# Patient Record
Sex: Male | Born: 1960 | Race: Black or African American | Hispanic: No | Marital: Single | Smoking: Never smoker
Health system: Southern US, Community
[De-identification: ages and names within clinical notes are randomized; demographics above are authoritative.]

## PROBLEM LIST (undated history)

## (undated) DIAGNOSIS — I10 Essential (primary) hypertension: Secondary | ICD-10-CM

---

## 2004-05-31 ENCOUNTER — Emergency Department (HOSPITAL_COMMUNITY): Admission: EM | Admit: 2004-05-31 | Discharge: 2004-05-31 | Payer: Self-pay | Admitting: Emergency Medicine

## 2004-08-25 ENCOUNTER — Ambulatory Visit (HOSPITAL_COMMUNITY): Admission: RE | Admit: 2004-08-25 | Discharge: 2004-08-25 | Payer: Self-pay | Admitting: Cardiology

## 2013-05-31 ENCOUNTER — Emergency Department (INDEPENDENT_AMBULATORY_CARE_PROVIDER_SITE_OTHER): Payer: BC Managed Care – PPO

## 2013-05-31 ENCOUNTER — Emergency Department (HOSPITAL_COMMUNITY)
Admission: EM | Admit: 2013-05-31 | Discharge: 2013-05-31 | Disposition: A | Discharge status: Home / Self Care | Payer: BC Managed Care – PPO | Source: Home / Self Care | Attending: Family Medicine | Admitting: Family Medicine

## 2013-05-31 ENCOUNTER — Encounter (HOSPITAL_COMMUNITY): Payer: Self-pay | Admitting: Emergency Medicine

## 2013-05-31 DIAGNOSIS — S8000XA Contusion of unspecified knee, initial encounter: Secondary | ICD-10-CM

## 2013-05-31 DIAGNOSIS — S6390XA Sprain of unspecified part of unspecified wrist and hand, initial encounter: Secondary | ICD-10-CM

## 2013-05-31 DIAGNOSIS — S66911A Strain of unspecified muscle, fascia and tendon at wrist and hand level, right hand, initial encounter: Secondary | ICD-10-CM

## 2013-05-31 DIAGNOSIS — S8001XA Contusion of right knee, initial encounter: Secondary | ICD-10-CM

## 2013-05-31 DIAGNOSIS — X58XXXA Exposure to other specified factors, initial encounter: Secondary | ICD-10-CM

## 2013-05-31 HISTORY — DX: Essential (primary) hypertension: I10

## 2013-05-31 NOTE — ED Provider Notes (Signed)
CSN: [id number]     Arrival date & time 05/31/13  1129 History   First MD Initiated Contact with Patient 05/31/13 1302     Chief Complaint  Patient presents with  . Joint Pain   (Consider location/radiation/quality/duration/timing/severity/associated sxs/prior Treatment) HPI Comments: Patient reports that he struck his right medial knee on the edge of his forklift about one week ago and area has remained uncomfortable with weight bearing since injury.  Then, in an unrelated incident, he reports that about one month ago he was lifting a 45 lb barbell at home and developed a "stinging pain" in the palm of his right hand and he would like this area evaluated today as well.  No previous injuries or surgeries to right hand or right knee.   The history is provided by the patient.    Past Medical History  Diagnosis Date  . Hypertension    History reviewed. No pertinent past surgical history. History reviewed. No pertinent family history. History  Substance Use Topics  . Smoking status: Never Smoker   . Smokeless tobacco: Not on file  . Alcohol Use: Yes    Review of Systems  All other systems reviewed and are negative.    Allergies  Review of patient's allergies indicates no known allergies.  Home Medications   Current Outpatient Rx  Name  Route  Sig  Dispense  Refill  . carvedilol (COREG) 12.5 MG tablet   Oral   Take 12.5 mg by mouth 2 (two) times daily with a meal.         . lisinopril (PRINIVIL,ZESTRIL) 20 MG tablet   Oral   Take 20 mg by mouth daily.          BP 145/98  Pulse 75  Temp(Src) 97.9 F (36.6 C) (Oral)  Resp 12  SpO2 98% Physical Exam  Nursing note and vitals reviewed. Constitutional: He is oriented to person, place, and time. He appears well-developed and well-nourished. No distress.  HENT:  Head: Normocephalic and atraumatic.  Eyes: Conjunctivae are normal.  Cardiovascular: Normal rate.   Pulmonary/Chest: Effort normal.  Musculoskeletal:      Right knee: He exhibits normal range of motion, no swelling, no effusion, no ecchymosis, no deformity, no laceration, no erythema and no bony tenderness.       Hands:      Legs: Neurological: He is alert and oriented to person, place, and time.  Skin: Skin is warm and dry.  Psychiatric: He has a normal mood and affect. His behavior is normal.    ED Course  Procedures (including critical care time) Labs Review Labs Reviewed - No data to display Imaging Review Dg Knee Complete 4 Views Right  05/31/2013   CLINICAL DATA:  Joint pain.  Medial knee pain.  EXAM: RIGHT KNEE - COMPLETE 4+ VIEW  COMPARISON:  None.  FINDINGS: There is no evidence of fracture, dislocation, or joint effusion. There is no evidence of arthropathy or other focal bone abnormality. Soft tissues are unremarkable.  IMPRESSION: Negative.   Electronically Signed   By: Sebastian AcheAllen  Grady   On: 05/31/2013 13:48   Dg Hand Complete Right  05/31/2013   CLINICAL DATA:  Injury right hand months ago lifting weights, pain at palmar surface of third MCP joint  EXAM: RIGHT HAND - COMPLETE 3+ VIEW  COMPARISON:  None  FINDINGS: Osseous mineralization normal.  Joint spaces preserved.  No fracture, dislocation, or bone destruction.  IMPRESSION: Normal exam.   Electronically Signed   By: Loraine LericheMark  Tyron Russell M.D.   On: 05/31/2013 13:49     MDM   1. Contusion of right knee   2. Strain of right hand    States he will not be filing Workman's Comp. Xrays of right knee and hand without bony abnormality. Advised using NSAIDs or tylenol as directed on packaging for discomfort. Follow up if no improvement.   Jess Barters [place], [place] 05/31/13 [phone]

## 2013-05-31 NOTE — ED Notes (Signed)
injury to right hand ~3-4 weeks ago, pain in right knee after struck on equipment 1 week ago. NAD

## 2013-05-31 NOTE — Discharge Instructions (Signed)
The xrays of your right knee and right hand were both normal. I would use either tylenol or ibuprofen as directed on packaging for discomfort and follow up with your primary care provider if symptoms do not improve.  Contusion A contusion is a deep bruise. Contusions are the result of an injury that caused bleeding under the skin. The contusion may turn blue, purple, or yellow. Minor injuries will give you a painless contusion, but more severe contusions may stay painful and swollen for a few weeks.  CAUSES  A contusion is usually caused by a blow, trauma, or direct force to an area of the body. SYMPTOMS   Swelling and redness of the injured area.  Bruising of the injured area.  Tenderness and soreness of the injured area.  Pain. DIAGNOSIS  The diagnosis can be made by taking a history and physical exam. An X-ray, CT scan, or MRI may be needed to determine if there were any associated injuries, such as fractures. TREATMENT  Specific treatment will depend on what area of the body was injured. In general, the best treatment for a contusion is resting, icing, elevating, and applying cold compresses to the injured area. Over-the-counter medicines may also be recommended for pain control. Ask your caregiver what the best treatment is for your contusion. HOME CARE INSTRUCTIONS   Put ice on the injured area.  Put ice in a plastic bag.  Place a towel between your skin and the bag.  Leave the ice on for 15-20 minutes, 03-04 times a day.  Only take over-the-counter or prescription medicines for pain, discomfort, or fever as directed by your caregiver. Your caregiver may recommend avoiding anti-inflammatory medicines (aspirin, ibuprofen, and naproxen) for 48 hours because these medicines may increase bruising.  Rest the injured area.  If possible, elevate the injured area to reduce swelling. SEEK IMMEDIATE MEDICAL CARE IF:   You have increased bruising or swelling.  You have pain that is  getting worse.  Your swelling or pain is not relieved with medicines. MAKE SURE YOU:   Understand these instructions.  Will watch your condition.  Will get help right away if you are not doing well or get worse. Document Released: 12/08/2004 Document Revised: 05/23/2011 Document Reviewed: 01/03/2011 [facility] Patient Information 2014 [employer], [place].

## 2013-06-02 NOTE — ED Provider Notes (Signed)
Medical screening examination/treatment/procedure(s) were performed by resident physician or non-physician practitioner and as supervising physician I was immediately available for consultation/collaboration.   Jarquavious Fentress DOUGLAS MD.   Daveda Larock D Wellington Winegarden, MD 06/02/13 1019 

## 2015-08-25 IMAGING — CR DG KNEE COMPLETE 4+V*R*
4 series · 4 of 4 positions shown · non-contrast
Comparison: None.

CLINICAL DATA: Joint pain.  Medial knee pain.

EXAM:
RIGHT KNEE - COMPLETE 4+ VIEW

[view not recorded (1 of 4)]
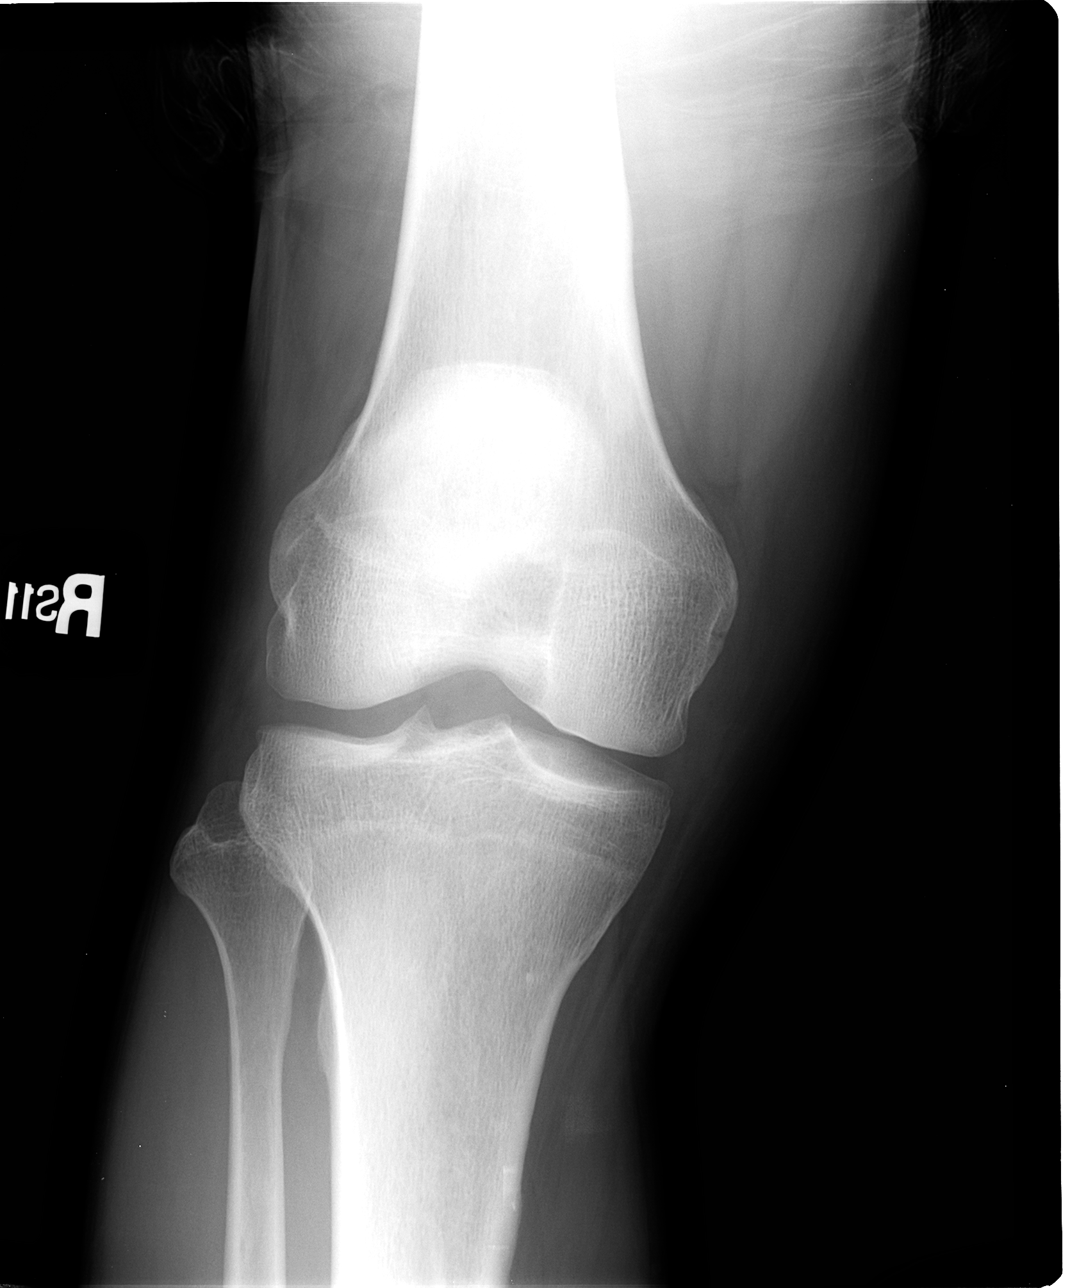

[view not recorded (2 of 4)]
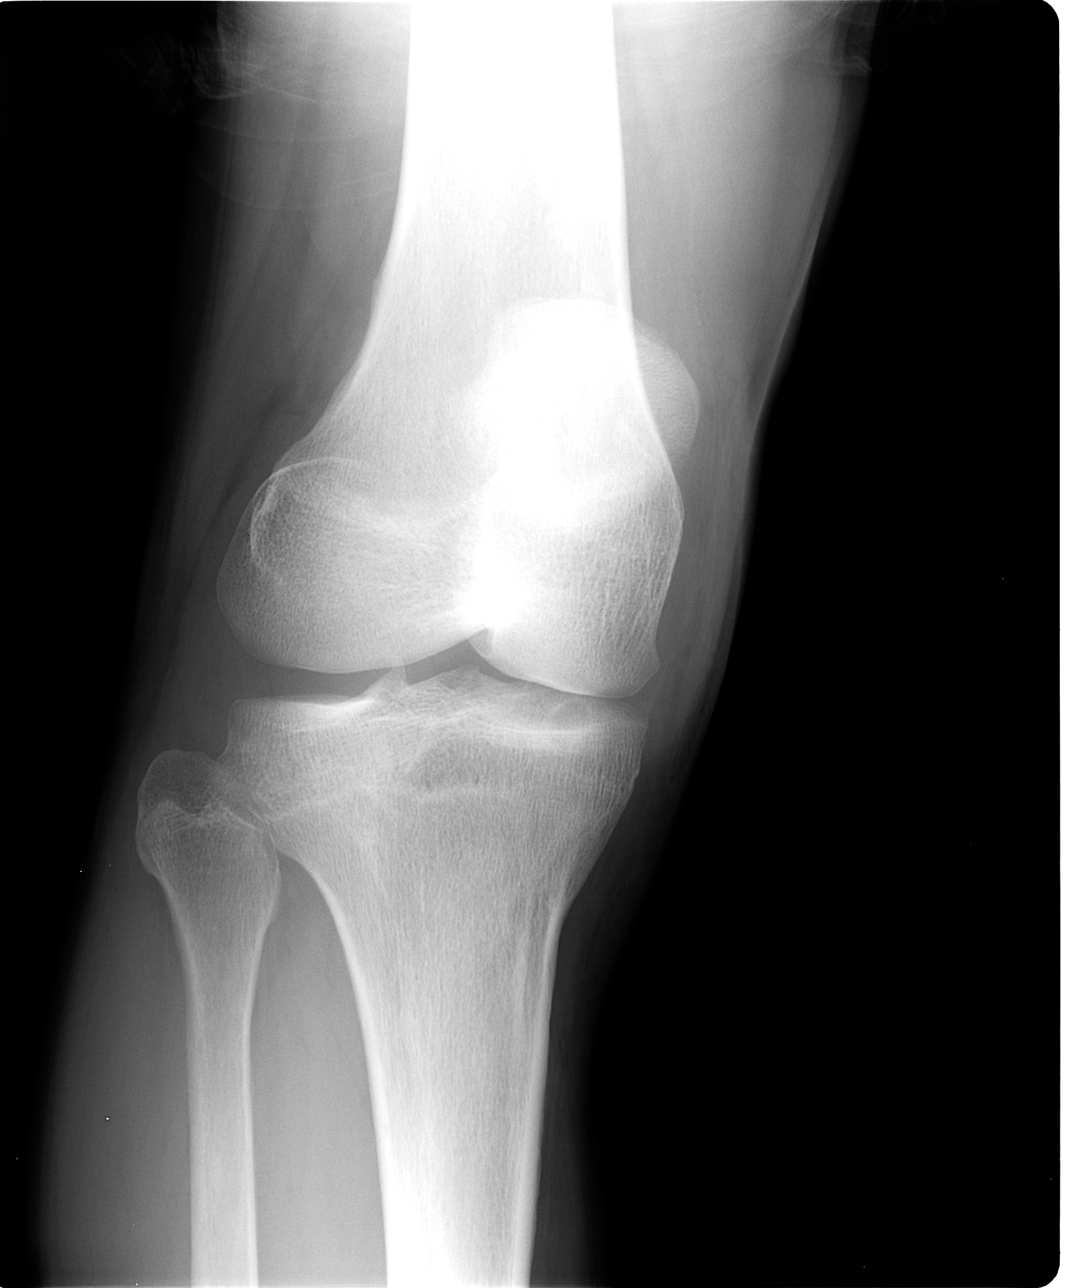

[view not recorded (3 of 4)]
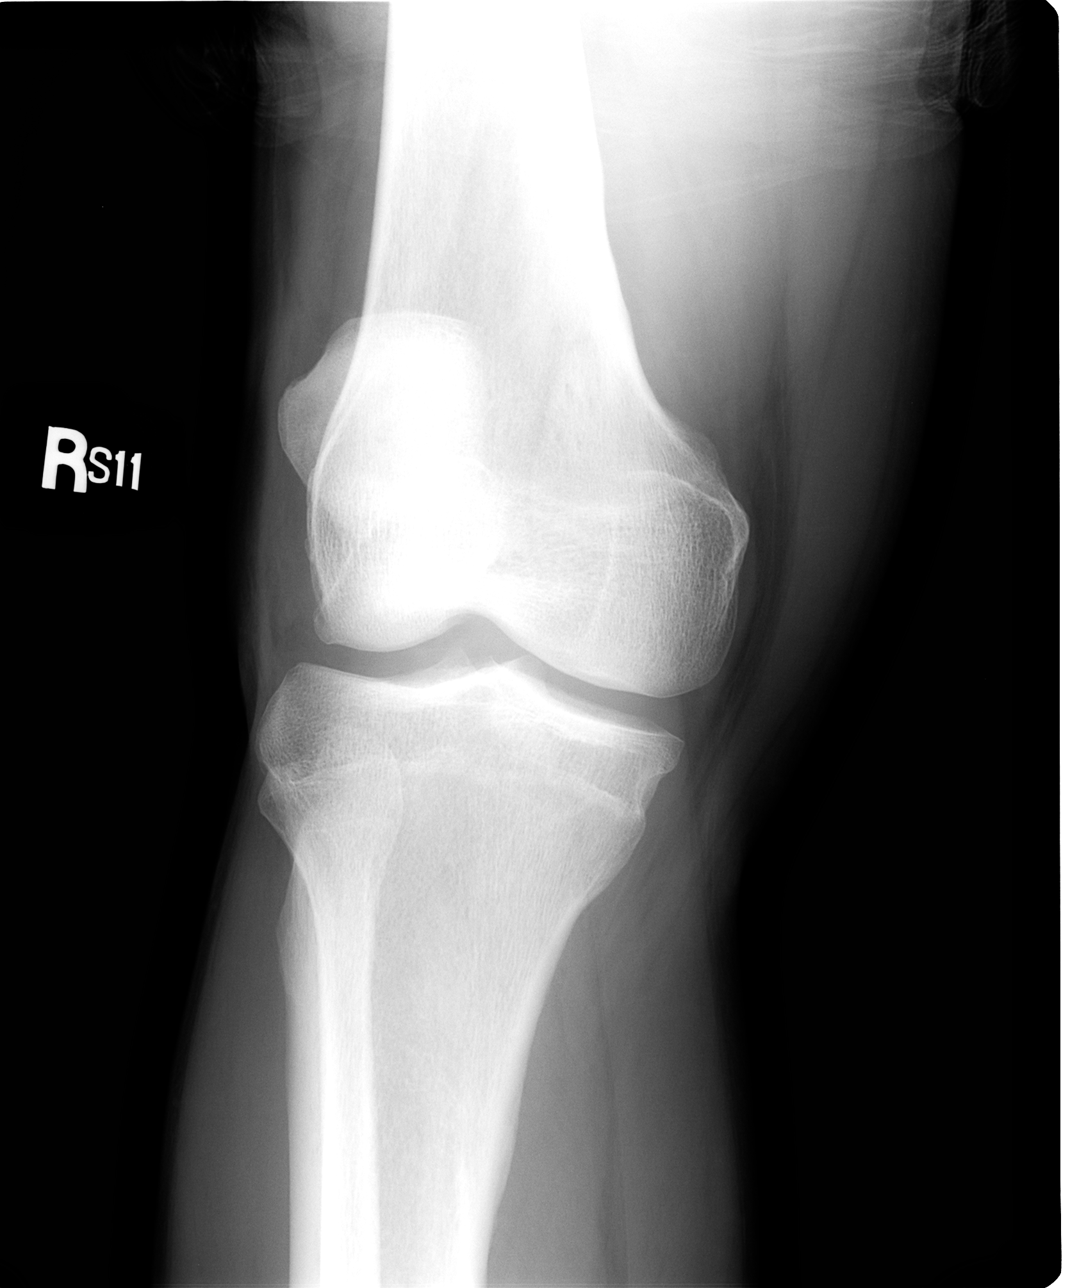

[view not recorded (4 of 4)]
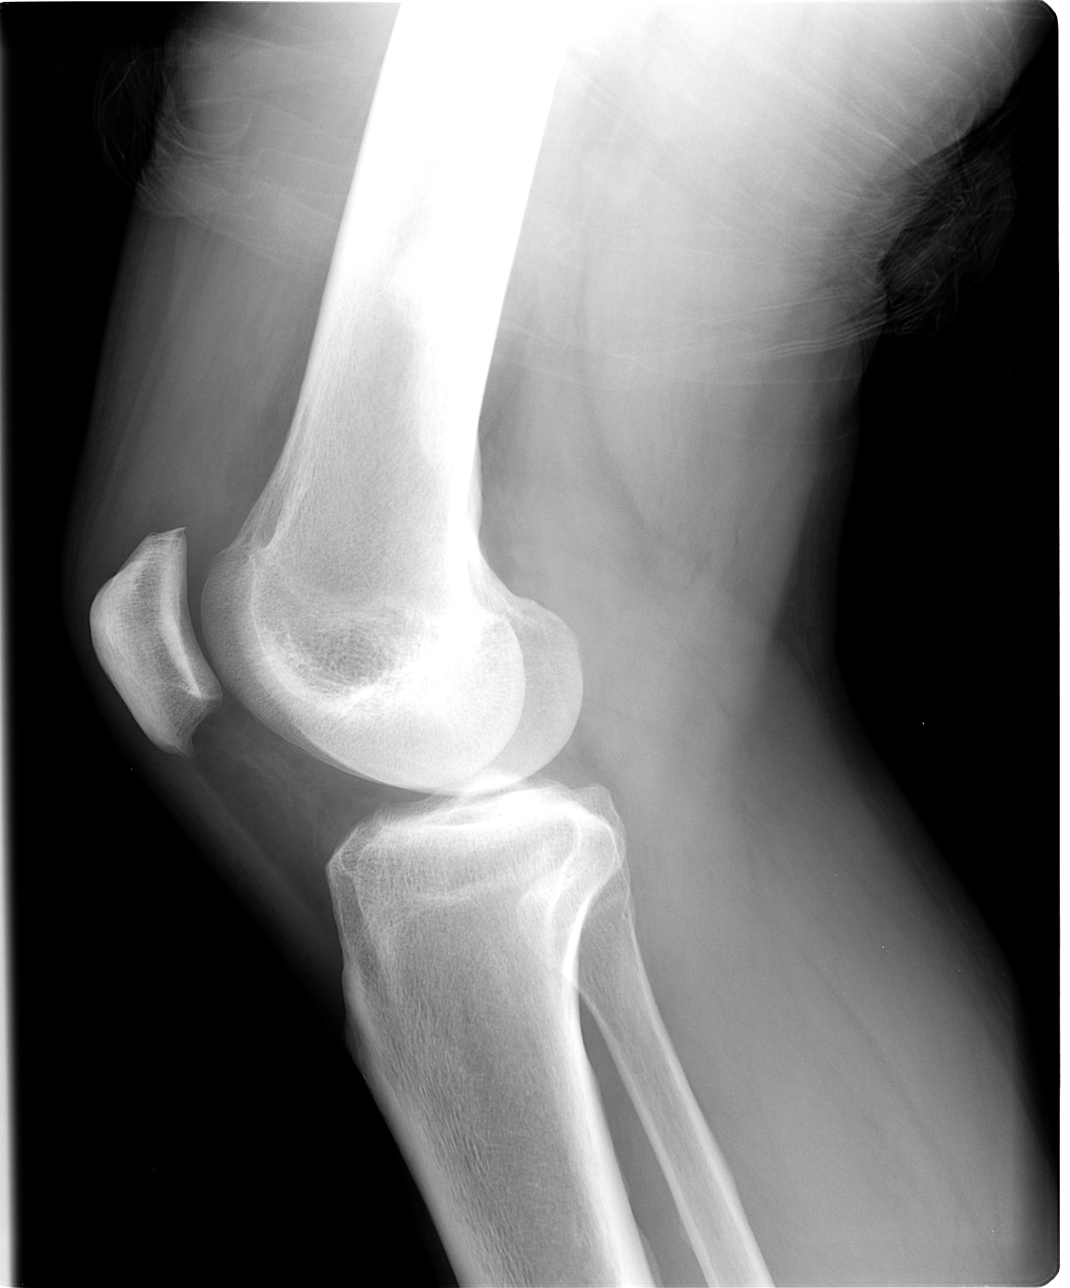

[4 of 4 positions shown; findings below may reference images not displayed]

FINDINGS: There is no evidence of fracture, dislocation, or joint effusion.
There is no evidence of arthropathy or other focal bone abnormality.
Soft tissues are unremarkable.
IMPRESSION: Negative.

## 2018-02-19 ENCOUNTER — Encounter (HOSPITAL_COMMUNITY): Payer: Self-pay | Admitting: Emergency Medicine

## 2018-02-19 ENCOUNTER — Emergency Department (HOSPITAL_COMMUNITY)
Admission: EM | Admit: 2018-02-19 | Discharge: 2018-02-19 | Disposition: A | Discharge status: Home / Self Care | Payer: BLUE CROSS/BLUE SHIELD | Attending: Emergency Medicine | Admitting: Emergency Medicine

## 2018-02-19 ENCOUNTER — Other Ambulatory Visit: Payer: Self-pay

## 2018-02-19 DIAGNOSIS — I10 Essential (primary) hypertension: Secondary | ICD-10-CM | POA: Insufficient documentation

## 2018-02-19 DIAGNOSIS — Z79899 Other long term (current) drug therapy: Secondary | ICD-10-CM | POA: Diagnosis not present

## 2018-02-19 DIAGNOSIS — M545 Low back pain, unspecified: Secondary | ICD-10-CM

## 2018-02-19 LAB — BASIC METABOLIC PANEL
Anion gap: 8 (ref 5–15)
BUN: 12 mg/dL (ref 6–20)
CALCIUM: 9.5 mg/dL (ref 8.9–10.3)
CHLORIDE: 104 mmol/L (ref 98–111)
CO2: 28 mmol/L (ref 22–32)
CREATININE: 1.28 mg/dL — AB (ref 0.61–1.24)
GFR calc Af Amer: >60 mL/min (ref >60)
GFR calc non Af Amer: >60 mL/min (ref >60)
Glucose, Bld: 87 mg/dL (ref 70–99)
Potassium: 4.3 mmol/L (ref 3.5–5.1)
SODIUM: 140 mmol/L (ref 135–145)

## 2018-02-19 LAB — CBC WITH DIFFERENTIAL/PLATELET
Abs Immature Granulocytes: 0.01 10*3/uL (ref 0.00–0.07)
Basophils Absolute: 0.1 10*3/uL (ref 0.0–0.1)
Basophils Relative: 1 %
EOS ABS: 0.1 10*3/uL (ref 0.0–0.5)
EOS PCT: 3 %
HCT: 47.5 % (ref 39.0–52.0)
HEMOGLOBIN: 15.4 g/dL (ref 13.0–17.0)
Immature Granulocytes: 0 %
LYMPHS PCT: 44 %
Lymphs Abs: 2.1 10*3/uL (ref 0.7–4.0)
MCH: 29.6 pg (ref 26.0–34.0)
MCHC: 32.4 g/dL (ref 30.0–36.0)
MCV: 91.3 fL (ref 80.0–100.0)
MONO ABS: 0.5 10*3/uL (ref 0.1–1.0)
Monocytes Relative: 11 %
Neutro Abs: 2 10*3/uL (ref 1.7–7.7)
Neutrophils Relative %: 41 %
Platelets: 245 10*3/uL (ref 150–400)
RBC: 5.2 MIL/uL (ref 4.22–5.81)
RDW: 11.7 % (ref 11.5–15.5)
WBC: 4.8 10*3/uL (ref 4.0–10.5)
nRBC: 0 % (ref 0.0–0.2)

## 2018-02-19 LAB — URINALYSIS, ROUTINE W REFLEX MICROSCOPIC
BILIRUBIN URINE: NEGATIVE
Glucose, UA: NEGATIVE mg/dL
HGB URINE DIPSTICK: NEGATIVE
KETONES UR: NEGATIVE mg/dL
Leukocytes, UA: NEGATIVE
NITRITE: NEGATIVE
Protein, ur: NEGATIVE mg/dL
SPECIFIC GRAVITY, URINE: 1.014 (ref 1.005–1.030)
pH: 5 (ref 5.0–8.0)

## 2018-02-19 MED ORDER — DIAZEPAM 10 MG PO TABS: 10.0000 mg | ORAL_TABLET | Freq: Four times a day (QID) | ORAL | 0 refills | Status: DC | PRN

## 2018-02-19 MED ORDER — KETOROLAC TROMETHAMINE 30 MG/ML IJ SOLN
15.0000 mg | Freq: Once | INTRAMUSCULAR | Status: AC
  Administered 2018-02-19: 15 mg via INTRAVENOUS
  Filled 2018-02-19: qty 1

## 2018-02-19 NOTE — ED Provider Notes (Signed)
[facility] EMERGENCY DEPARTMENT Provider Note   CSN: [id number] Arrival date & time: 02/19/18  4098     History   Chief Complaint Chief Complaint  Patient presents with  . Flank Pain    HPI Martin Small is a 57 y.o. male.  HPI  He presents for evaluation of right lower back pain present for 2 weeks, worse when ambulating especially going downstairs.  He has had some urinary frequency but no dysuria or hematuria.  No known trauma.  No prior similar incidents.  He has been using Tylenol with partial relief.  Pain comes and goes at times he has no pain.  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  There are no other known modifying factors.  Past Medical History:  Diagnosis Date  . Hypertension     There are no active problems to display for this patient.   History reviewed. No pertinent surgical history.      Home Medications    Prior to Admission medications   Medication Sig Start Date End Date Taking? Authorizing Provider  carvedilol (COREG) 12.5 MG tablet Take 12.5 mg by mouth 2 (two) times daily with a meal.    [provider]  lisinopril (PRINIVIL,ZESTRIL) 20 MG tablet Take 20 mg by mouth daily.    [provider]    Family History No family history on file.  Social History Social History   Tobacco Use  . Smoking status: Never Smoker  . Smokeless tobacco: Never Used  Substance Use Topics  . Alcohol use: Yes  . Drug use: Not Currently     Allergies   Patient has no known allergies.   Review of Systems Review of Systems  All other systems reviewed and are negative.    Physical Exam Updated Vital Signs BP (!) 177/110 (BP Location: Left Arm)   Pulse 81   Temp 97.7 F (36.5 C) (Oral)   Resp 17   Ht 6\' 1"  (1.854 m)   Wt 97.5 kg   SpO2 97%   BMI 28.37 kg/m   Physical Exam  Constitutional: He is oriented to person, place, and time. He appears well-developed and  well-nourished. He appears distressed (Somewhat uncomfortable).  HENT:  Head: Normocephalic and atraumatic.  Right Ear: External ear normal.  Left Ear: External ear normal.  Eyes: Pupils are equal, round, and reactive to light. Conjunctivae and EOM are normal.  Neck: Normal range of motion and phonation normal. Neck supple.  Cardiovascular: Normal rate, regular rhythm and normal heart sounds.  Pulmonary/Chest: Effort normal and breath sounds normal. He exhibits no bony tenderness.  Abdominal: Soft. There is no tenderness.  Genitourinary:  Genitourinary Comments: No costovertebral angle tenderness.  Musculoskeletal: Normal range of motion.  Mild right lumbar tenderness.  Neurological: He is alert and oriented to person, place, and time. No cranial nerve deficit or sensory deficit. He exhibits normal muscle tone. Coordination normal.  Skin: Skin is warm, dry and intact.  Psychiatric: He has a normal mood and affect. His behavior is normal. Judgment and thought content normal.  Nursing note and vitals reviewed.    ED Treatments / Results  Labs (all labs ordered are listed, but only abnormal results are displayed) Labs Reviewed  URINALYSIS, ROUTINE W REFLEX MICROSCOPIC    EKG None  Radiology No results found.  Procedures Procedures (including critical care time)  Medications Ordered in ED Medications - No data to display   Initial Impression /  Assessment and Plan / ED Course  I have reviewed the triage vital signs and the nursing notes.  Pertinent labs & imaging results that were available during my care of the patient were reviewed by me and considered in my medical decision making (see chart for details).  Clinical Course as of Feb 20 903  Mon Feb 19, 2018  0903 Normal  CBC with Differential [EW]  0903 Normal except creatinine slightly elevated  Basic metabolic panel(!) [EW]  [id number] Normal  Urinalysis, Routine w reflex microscopic- may I&O cath if menses(!) [EW]      Clinical Course User Index [EW] Mancel BaleWentz, Kindell Strada, MD     Patient Vitals for the past 24 hrs:  BP Temp Temp src Pulse Resp SpO2 Height Weight  02/19/18 0715 (!) 177/98 - - 83 - 99 % - -  02/19/18 0655 (!) 177/110 97.7 F (36.5 C) Oral 81 17 97 % - -  02/19/18 0648 - - - - - - 6\' 1"  (1.854 m) 97.5 kg    9:03 AM Reevaluation with update and discussion. After initial assessment and treatment, an updated evaluation reveals he is comfortable at this time.  My male is with him who request that he get something for pain, and a muscle relaxer.  I discussed this with the patient who prefers to take ibuprofen for pain, and agrees to a prescription muscle relaxer.  He will have to stay out of work for 2 days, while using the muscle relaxer.  All questions answered. Mancel BaleElliott Severiano Utsey   Medical Decision Making: Nonspecific low back pain likely muscular.  Doubt myelopathy, fracture, UTI, metabolic instability or impending vascular collapse.  CRITICAL CARE-no Performed by: Mancel BaleElliott Adis Sturgill   Nursing Notes Reviewed/ Care Coordinated Applicable Imaging Reviewed Interpretation of Laboratory Data incorporated into ED treatment  The patient appears reasonably screened and/or stabilized for discharge and I doubt any other medical condition or other [facility] requiring further screening, evaluation, or treatment in the ED at this time prior to discharge.  Plan: Home Medications-ibuprofen 3 times daily with meals; Home Treatments-rest, heat, gradually increase activity; return here if the recommended treatment, does not improve the symptoms; Recommended follow up- PCP, if not better in 4 to 5 days.     Final Clinical Impressions(s) / ED Diagnoses   Final diagnoses:  None    ED Discharge Orders    None       Mancel BaleWentz, Rinaldo Macqueen, MD 02/19/18 [phone]

## 2018-02-19 NOTE — Discharge Instructions (Signed)
Use heat on the lower back several times a day for 30 to 40 minutes.  Try gently stretching your back, as your pain improves.  See the doctor of your choice if not better in 4 or 5 days.  To help the pain, use ibuprofen 3 times a day with meals for 1 week.  Do not drive, or work, while using the muscle relaxer.

## 2018-02-19 NOTE — ED Triage Notes (Signed)
C/o R flank pain x 2 weeks that is gradually getting worse.  Also reports frequent urination.

## 2018-02-19 NOTE — ED Notes (Signed)
Signature pad not available. Patient verbalized understanding of discharge instructions.   

## 2024-01-07 ENCOUNTER — Emergency Department (HOSPITAL_COMMUNITY)
Admission: EM | Admit: 2024-01-07 | Discharge: 2024-01-07 | Disposition: A | Discharge status: Home / Self Care | Attending: Emergency Medicine | Admitting: Emergency Medicine

## 2024-01-07 ENCOUNTER — Encounter (HOSPITAL_COMMUNITY): Payer: Self-pay | Admitting: Emergency Medicine

## 2024-01-07 ENCOUNTER — Other Ambulatory Visit: Payer: Self-pay

## 2024-01-07 DIAGNOSIS — R059 Cough, unspecified: Secondary | ICD-10-CM | POA: Diagnosis present

## 2024-01-07 DIAGNOSIS — M5417 Radiculopathy, lumbosacral region: Secondary | ICD-10-CM | POA: Diagnosis not present

## 2024-01-07 LAB — URINALYSIS, ROUTINE W REFLEX MICROSCOPIC
Bilirubin Urine: NEGATIVE
Glucose, UA: NEGATIVE mg/dL
Hgb urine dipstick: NEGATIVE
Ketones, ur: NEGATIVE mg/dL
Leukocytes,Ua: NEGATIVE
Nitrite: NEGATIVE
Protein, ur: NEGATIVE mg/dL
Specific Gravity, Urine: 1.019 (ref 1.005–1.030)
pH: 5 (ref 5.0–8.0)

## 2024-01-07 LAB — RESP PANEL BY RT-PCR (RSV, FLU A&B, COVID)  RVPGX2
Influenza A by PCR: NEGATIVE
Influenza B by PCR: NEGATIVE
Resp Syncytial Virus by PCR: NEGATIVE
SARS Coronavirus 2 by RT PCR: NEGATIVE

## 2024-01-07 MED ORDER — DICLOFENAC EPOLAMINE 1.3 % EX PTCH: 1.0000 | MEDICATED_PATCH | Freq: Two times a day (BID) | CUTANEOUS | 1 refills | Status: DC

## 2024-01-07 MED ORDER — DICLOFENAC EPOLAMINE 1.3 % EX PTCH: 1.0000 | MEDICATED_PATCH | Freq: Two times a day (BID) | CUTANEOUS | 1 refills | Status: AC

## 2024-01-07 MED ORDER — KETOROLAC TROMETHAMINE 15 MG/ML IJ SOLN
15.0000 mg | Freq: Once | INTRAMUSCULAR | Status: AC
  Administered 2024-01-07: 15 mg via INTRAMUSCULAR
  Filled 2024-01-07: qty 1

## 2024-01-07 MED ORDER — LISINOPRIL 20 MG PO TABS: 20.0000 mg | ORAL_TABLET | Freq: Every day | ORAL | 1 refills | Status: AC

## 2024-01-07 MED ORDER — CARVEDILOL 12.5 MG PO TABS: 12.5000 mg | ORAL_TABLET | Freq: Two times a day (BID) | ORAL | 1 refills | Status: DC

## 2024-01-07 MED ORDER — CARVEDILOL 12.5 MG PO TABS
12.5000 mg | ORAL_TABLET | Freq: Two times a day (BID) | ORAL | Status: DC
  Administered 2024-01-07: 12.5 mg via ORAL
  Filled 2024-01-07 (×2): qty 1

## 2024-01-07 MED ORDER — DICLOFENAC EPOLAMINE 1.3 % EX PTCH
1.0000 | MEDICATED_PATCH | Freq: Two times a day (BID) | CUTANEOUS | Status: DC
  Administered 2024-01-07: 1 via TRANSDERMAL
  Filled 2024-01-07 (×2): qty 1

## 2024-01-07 MED ORDER — HYDROCODONE-ACETAMINOPHEN 5-325 MG PO TABS
1.0000 | ORAL_TABLET | Freq: Once | ORAL | Status: AC
  Administered 2024-01-07: 1 via ORAL
  Filled 2024-01-07: qty 1

## 2024-01-07 MED ORDER — LISINOPRIL 20 MG PO TABS: 20.0000 mg | ORAL_TABLET | Freq: Every day | ORAL | 1 refills | Status: DC

## 2024-01-07 MED ORDER — CARVEDILOL 12.5 MG PO TABS: 12.5000 mg | ORAL_TABLET | Freq: Two times a day (BID) | ORAL | 1 refills | Status: AC

## 2024-01-07 MED ORDER — DEXAMETHASONE SOD PHOSPHATE PF 10 MG/ML IJ SOLN
10.0000 mg | Freq: Once | INTRAMUSCULAR | Status: AC
  Administered 2024-01-07: 10 mg via INTRAMUSCULAR

## 2024-01-07 MED ORDER — PREDNISONE 20 MG PO TABS: 40.0000 mg | ORAL_TABLET | Freq: Every day | ORAL | 0 refills | Status: DC

## 2024-01-07 MED ORDER — PREDNISONE 20 MG PO TABS: 40.0000 mg | ORAL_TABLET | Freq: Every day | ORAL | 0 refills | Status: AC

## 2024-01-07 MED ORDER — ACETAMINOPHEN 500 MG PO TABS
1000.0000 mg | ORAL_TABLET | Freq: Once | ORAL | Status: AC
Start: 2024-01-07 — End: 2024-01-07
  Administered 2024-01-07: 1000 mg via ORAL
  Filled 2024-01-07: qty 2

## 2024-01-07 MED ORDER — LISINOPRIL 10 MG PO TABS
20.0000 mg | ORAL_TABLET | Freq: Every day | ORAL | Status: DC
  Administered 2024-01-07: 20 mg via ORAL
  Filled 2024-01-07 (×2): qty 2

## 2024-01-07 NOTE — Discharge Instructions (Addendum)
 Today's evaluation has been generally reassuring.  It is important to obtain and take your medication as prescribed.  If the medicated patches are not covered by insurance, consider obtaining over-the-counter Salonpas products with the ingredients methyl salicylate and lidocaine and lieu of the patches. You have been provided multiple avenues for primary care follow-up, please arrange this in the coming days.  Return here for concerning changes in condition.

## 2024-01-07 NOTE — ED Provider Notes (Signed)
 Patient with mild temperature increase just prior to departure.  COVID flu sent, urinalysis sent.  Urinalysis unremarkable.  Patient requests discharge, and after receiving Tylenol, with no distress, sitting upright, no evidence of bacteremia, sepsis, patient will follow COVID flu results as an outpatient.   Garrick Charleston, MD 01/07/24 [phone]

## 2024-01-07 NOTE — ED Triage Notes (Signed)
 Pt reports posterior right leg pain that started on Wednesday. Pt reports the pain is worse when he bends over. Pt also c/o weakness in the right eye.

## 2024-01-07 NOTE — ED Provider Notes (Signed)
 [place] EMERGENCY DEPARTMENT AT [facility] Provider Note   CSN: [id number] Arrival date & time: 01/07/24  1205     Patient presents with: Leg Pain   Martin Small is a 63 y.o. male.   HPI Patient with a history of sciatica presents with leg pain, 4 days. He notes that he did possibly sleep funny, jumped quickly from his couch, roughly at the time of worsening pain.  For the past 4 days has had pain radiating from the right gluteus posterior, inferiorly to the foot.  No incontinence, abdominal pain, systemic complaints, no relief with ibuprofen.    Prior to Admission medications   Medication Sig Start Date End Date Taking? Authorizing Provider  carvedilol (COREG) 12.5 MG tablet Take 1 tablet (12.5 mg total) by mouth 2 (two) times daily with a meal. 01/07/24  Yes Garrick Charleston, MD  diclofenac (FLECTOR) 1.3 % PTCH Place 1 patch onto the skin 2 (two) times daily. 01/07/24  Yes Garrick Charleston, MD  ibuprofen (ADVIL) 200 MG tablet Take 600 mg by mouth 2 (two) times daily as needed for headache (pain).   Yes [provider]  lisinopril (ZESTRIL) 20 MG tablet Take 1 tablet (20 mg total) by mouth daily. 01/07/24  Yes Garrick Charleston, MD  predniSONE (DELTASONE) 20 MG tablet Take 2 tablets (40 mg total) by mouth daily with breakfast. For the next four days 01/07/24  Yes Garrick Charleston, MD  timolol (BETIMOL) 0.5 % ophthalmic solution Place 1 drop into the right eye 2 (two) times daily.   Yes [provider]    Allergies: Patient has no known allergies.    Review of Systems  Updated Vital Signs BP (!) 172/106   Pulse 73   Temp 98.8 F (37.1 C) (Oral)   Resp 18   SpO2 95%   Physical Exam Vitals and nursing note reviewed.  Constitutional:      General: He is not in acute distress.    Appearance: He is well-developed.  HENT:     Head: Normocephalic and atraumatic.  Eyes:     Conjunctiva/sclera: Conjunctivae normal.  Cardiovascular:     Rate  and Rhythm: Normal rate and regular rhythm.     Pulses: Normal pulses.  Pulmonary:     Effort: Pulmonary effort is normal. No respiratory distress.     Breath sounds: No stridor.  Abdominal:     General: There is no distension.  Musculoskeletal:     Comments: No deformity, no loss of range of motion of the lower extremities, patient does have some pain referred to the lower back with resistance against hip flexion.  Skin:    General: Skin is warm and dry.  Neurological:     General: No focal deficit present.     Mental Status: He is alert and oriented to person, place, and time.     (all labs ordered are listed, but only abnormal results are displayed) Labs Reviewed - No data to display  EKG: None  Radiology: No results found.   Procedures   Medications Ordered in the ED  diclofenac (FLECTOR) 1.3 % 1 patch (1 patch Transdermal Patch Applied 01/07/24 1405)  lisinopril (ZESTRIL) tablet 20 mg (20 mg Oral Given 01/07/24 1422)  carvedilol (COREG) tablet 12.5 mg (12.5 mg Oral Given 01/07/24 1417)  dexamethasone (DECADRON) injection 10 mg (10 mg Intramuscular Given 01/07/24 1401)  ketorolac  (TORADOL ) 15 MG/ML injection 15 mg (15 mg Intramuscular Given 01/07/24 1402)  HYDROcodone-acetaminophen (NORCO/VICODIN) 5-325 MG per tablet 1  tablet (1 tablet Oral Given 01/07/24 1359)                                    Medical Decision Making Adult male with history of sciatica presents to leg pain with signs and symptoms consistent with sciatica, no red flags such as abdominal pain, nausea, vomiting suggesting CNS dysfunction.  Patient received analgesics, topical and IM, initial vital signs reassuring  Amount and/or Complexity of Data Reviewed External Data Reviewed: notes.    Details: Had a cardiac eval 5 years ago here  Risk Prescription drug management. Decision regarding hospitalization.   3:31 PM On repeat exam patient notes his condition has improved.  Blood pressure has  started to decrease after being provided his home medications.  Patient awake, alert, without other complaints, we discussed possibilities for pain, as above without red flags suggesting CNS disruption, fever suggesting infection, or trauma suggesting fracture, symptoms, signs most consistent with lumbosacral radiculopathy.     Final diagnoses:  Lumbosacral radiculopathy    ED Discharge Orders          Ordered    lisinopril (ZESTRIL) 20 MG tablet  Daily        01/07/24 1530    diclofenac (FLECTOR) 1.3 % PTCH  2 times daily        01/07/24 1530    carvedilol (COREG) 12.5 MG tablet  2 times daily with meals        01/07/24 1530    predniSONE (DELTASONE) 20 MG tablet  Daily with breakfast        01/07/24 1530               Garrick Charleston, MD 01/07/24 1531
